# Patient Record
Sex: Female | Born: 1965 | Race: White | Hispanic: No | Marital: Single | Smoking: Current every day smoker
Health system: Southern US, Community
[De-identification: ages and names within clinical notes are randomized; demographics above are authoritative.]

## PROBLEM LIST (undated history)

## (undated) DIAGNOSIS — E079 Disorder of thyroid, unspecified: Secondary | ICD-10-CM

## (undated) DIAGNOSIS — I10 Essential (primary) hypertension: Secondary | ICD-10-CM

## (undated) DIAGNOSIS — B1920 Unspecified viral hepatitis C without hepatic coma: Secondary | ICD-10-CM

## (undated) DIAGNOSIS — Z22322 Carrier or suspected carrier of Methicillin resistant Staphylococcus aureus: Secondary | ICD-10-CM

## (undated) HISTORY — DX: Essential (primary) hypertension: I10

---

## 2001-08-13 ENCOUNTER — Other Ambulatory Visit: Admission: RE | Admit: 2001-08-13 | Discharge: 2001-08-13 | Payer: Self-pay | Admitting: Internal Medicine

## 2012-03-22 ENCOUNTER — Other Ambulatory Visit: Payer: Self-pay

## 2012-03-29 ENCOUNTER — Encounter: Payer: Self-pay | Admitting: *Deleted

## 2012-03-29 ENCOUNTER — Encounter: Payer: Self-pay | Admitting: Physician Assistant

## 2013-12-28 ENCOUNTER — Emergency Department (HOSPITAL_COMMUNITY): Payer: Self-pay

## 2013-12-28 ENCOUNTER — Emergency Department (HOSPITAL_COMMUNITY)
Admission: EM | Admit: 2013-12-28 | Discharge: 2013-12-28 | Disposition: A | Discharge status: Home / Self Care | Payer: Self-pay | Attending: Emergency Medicine | Admitting: Emergency Medicine

## 2013-12-28 ENCOUNTER — Encounter (HOSPITAL_COMMUNITY): Payer: Self-pay | Admitting: Emergency Medicine

## 2013-12-28 DIAGNOSIS — Y9389 Activity, other specified: Secondary | ICD-10-CM | POA: Insufficient documentation

## 2013-12-28 DIAGNOSIS — Z8614 Personal history of Methicillin resistant Staphylococcus aureus infection: Secondary | ICD-10-CM | POA: Insufficient documentation

## 2013-12-28 DIAGNOSIS — Z792 Long term (current) use of antibiotics: Secondary | ICD-10-CM | POA: Insufficient documentation

## 2013-12-28 DIAGNOSIS — L02419 Cutaneous abscess of limb, unspecified: Secondary | ICD-10-CM | POA: Insufficient documentation

## 2013-12-28 DIAGNOSIS — L03116 Cellulitis of left lower limb: Secondary | ICD-10-CM

## 2013-12-28 DIAGNOSIS — Y92410 Unspecified street and highway as the place of occurrence of the external cause: Secondary | ICD-10-CM | POA: Insufficient documentation

## 2013-12-28 DIAGNOSIS — F17200 Nicotine dependence, unspecified, uncomplicated: Secondary | ICD-10-CM | POA: Insufficient documentation

## 2013-12-28 DIAGNOSIS — Z8619 Personal history of other infectious and parasitic diseases: Secondary | ICD-10-CM | POA: Insufficient documentation

## 2013-12-28 DIAGNOSIS — I10 Essential (primary) hypertension: Secondary | ICD-10-CM | POA: Insufficient documentation

## 2013-12-28 DIAGNOSIS — Z79899 Other long term (current) drug therapy: Secondary | ICD-10-CM | POA: Insufficient documentation

## 2013-12-28 DIAGNOSIS — L03119 Cellulitis of unspecified part of limb: Principal | ICD-10-CM

## 2013-12-28 HISTORY — DX: Unspecified viral hepatitis C without hepatic coma: B19.20

## 2013-12-28 HISTORY — DX: Carrier or suspected carrier of methicillin resistant Staphylococcus aureus: Z22.322

## 2013-12-28 LAB — BASIC METABOLIC PANEL
BUN: 16 mg/dL (ref 6–23)
CALCIUM: 9.5 mg/dL (ref 8.4–10.5)
CO2: 24 mEq/L (ref 19–32)
Chloride: 98 mEq/L (ref 96–112)
Creatinine, Ser: 0.66 mg/dL (ref 0.50–1.10)
GFR calc Af Amer: >90 mL/min (ref >90)
GFR calc non Af Amer: >90 mL/min (ref >90)
GLUCOSE: 90 mg/dL (ref 70–99)
POTASSIUM: 3.9 meq/L (ref 3.7–5.3)
SODIUM: 136 meq/L — AB (ref 137–147)

## 2013-12-28 LAB — GLUCOSE, CAPILLARY: Glucose-Capillary: 150 mg/dL — ABNORMAL HIGH (ref 70–99)

## 2013-12-28 MED ORDER — VANCOMYCIN HCL IN DEXTROSE 1-5 GM/200ML-% IV SOLN
1000.0000 mg | Freq: Once | INTRAVENOUS | Status: AC
  Administered 2013-12-28: 1000 mg via INTRAVENOUS
  Filled 2013-12-28: qty 200

## 2013-12-28 MED ORDER — SODIUM CHLORIDE 0.9 % IV BOLUS (SEPSIS)
500.0000 mL | Freq: Once | INTRAVENOUS | Status: AC
  Administered 2013-12-28: 500 mL via INTRAVENOUS

## 2013-12-28 MED ORDER — SULFAMETHOXAZOLE-TRIMETHOPRIM 800-160 MG PO TABS: 1.0000 | ORAL_TABLET | Freq: Two times a day (BID) | ORAL | Status: DC

## 2013-12-28 MED ORDER — HYDROMORPHONE HCL PF 1 MG/ML IJ SOLN
1.0000 mg | Freq: Once | INTRAMUSCULAR | Status: AC
  Administered 2013-12-28: 1 mg via INTRAVENOUS
  Filled 2013-12-28: qty 1

## 2013-12-28 MED ORDER — OXYCODONE-ACETAMINOPHEN 5-325 MG PO TABS: 2.0000 | ORAL_TABLET | ORAL | Status: DC | PRN

## 2013-12-28 MED ORDER — ONDANSETRON HCL 4 MG/2ML IJ SOLN
4.0000 mg | Freq: Once | INTRAMUSCULAR | Status: AC
  Administered 2013-12-28: 4 mg via INTRAVENOUS
  Filled 2013-12-28: qty 2
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
## 2013-12-28 NOTE — Discharge Instructions (Signed)
X-ray and glucose were okay.  Recheck here in 2-3 days.   Take daily shower with soap and water. Simple dressing.   Restart antibiotic tomorrow.  Elevate leg. Pain medication

## 2013-12-28 NOTE — ED Notes (Signed)
cbg was 150 at this time. Kristine Wong
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
## 2013-12-28 NOTE — Progress Notes (Signed)
ANTIBIOTIC CONSULT NOTE - INITIAL  Pharmacy Consult for Vancomycin Indication: cellulitis  No Known Allergies  Patient Measurements: Height: 5\' 4"  (162.6 cm) Weight: 170 lb (77.111 kg) IBW/kg (Calculated) : 54.7  Vital Signs: Temp: 98 F (36.7 C) (01/10 1440) Temp src: Oral (01/10 1852) BP: 105/58 mmHg (01/10 1852) Pulse Rate: 58 (01/10 1852) Intake/Output from previous day:   Intake/Output from this shift:    Labs:  Recent Labs  12/28/13 1825  CREATININE 0.66   Estimated Creatinine Clearance: 86.5 ml/min (by C-G formula based on Cr of 0.66). No results found for this basename: VANCOTROUGH, VANCOPEAK, VANCORANDOM, GENTTROUGH, GENTPEAK, GENTRANDOM, TOBRATROUGH, TOBRAPEAK, TOBRARND, AMIKACINPEAK, AMIKACINTROU, AMIKACIN,  in the last 72 hours   Microbiology: No results found for this or any previous visit (from the past 720 hour(s)).  Medical History: Past Medical History  Diagnosis Date  . Essential hypertension, benign   . Hepatitis C   . MRSA (methicillin resistant staph aureus) culture positive     Medications:  Scheduled:   Assessment: Okay for Protocol, Vancomycin 1000mg  ordered in ED.  Goal of Therapy:  Vancomycin trough level 10-15 mcg/ml  Plan:  Will continue Vancomycin 1000mg  every 12 hours if admitted and Vancomycin continued. Measure antibiotic drug levels at steady state Follow up culture results  Mady GemmaHayes, Pansie Guggisberg R 12/28/2013,6:57 PM

## 2014-01-12 ENCOUNTER — Encounter (HOSPITAL_COMMUNITY): Payer: Self-pay | Admitting: Emergency Medicine

## 2014-01-12 ENCOUNTER — Emergency Department (HOSPITAL_COMMUNITY)
Admission: EM | Admit: 2014-01-12 | Discharge: 2014-01-12 | Disposition: A | Discharge status: Home / Self Care | Payer: Self-pay | Attending: Emergency Medicine | Admitting: Emergency Medicine

## 2014-01-12 DIAGNOSIS — Z79899 Other long term (current) drug therapy: Secondary | ICD-10-CM | POA: Insufficient documentation

## 2014-01-12 DIAGNOSIS — Z8614 Personal history of Methicillin resistant Staphylococcus aureus infection: Secondary | ICD-10-CM | POA: Insufficient documentation

## 2014-01-12 DIAGNOSIS — I10 Essential (primary) hypertension: Secondary | ICD-10-CM | POA: Insufficient documentation

## 2014-01-12 DIAGNOSIS — F17200 Nicotine dependence, unspecified, uncomplicated: Secondary | ICD-10-CM | POA: Insufficient documentation

## 2014-01-12 DIAGNOSIS — IMO0002 Reserved for concepts with insufficient information to code with codable children: Secondary | ICD-10-CM

## 2014-01-12 DIAGNOSIS — N949 Unspecified condition associated with female genital organs and menstrual cycle: Secondary | ICD-10-CM | POA: Insufficient documentation

## 2014-01-12 DIAGNOSIS — T7421XA Adult sexual abuse, confirmed, initial encounter: Secondary | ICD-10-CM | POA: Insufficient documentation

## 2014-01-12 MED ORDER — LEVONORGESTREL 0.75 MG PO TABS
ORAL_TABLET | ORAL | Status: AC
  Administered 2014-01-12: 17:00:00
  Filled 2014-01-12: qty 2

## 2014-01-12 MED ORDER — CEFIXIME 400 MG PO TABS
ORAL_TABLET | ORAL | Status: AC
  Administered 2014-01-12: 400 mg
  Filled 2014-01-12: qty 1

## 2014-01-12 MED ORDER — AZITHROMYCIN 1 G PO PACK
PACK | ORAL | Status: AC
  Administered 2014-01-12: 1 g
  Filled 2014-01-12: qty 1

## 2014-01-12 MED ORDER — PROMETHAZINE HCL 25 MG PO TABS
ORAL_TABLET | ORAL | Status: AC
  Administered 2014-01-12: 12.5 mg
  Filled 2014-01-12: qty 3

## 2014-01-12 MED ORDER — METRONIDAZOLE 500 MG PO TABS
ORAL_TABLET | ORAL | Status: AC
  Administered 2014-01-12: 2000 mg
  Filled 2014-01-12: qty 4
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
## 2014-01-12 NOTE — ED Provider Notes (Signed)
Medical screening examination/treatment/procedure(s) were performed by non-physician practitioner and as supervising physician I was immediately available for consultation/collaboration.  EKG Interpretation   None         Marquel Spoto L Kidada Ging, MD 01/12/14 2248 
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
## 2015-01-18 ENCOUNTER — Emergency Department (HOSPITAL_COMMUNITY)
Admission: EM | Admit: 2015-01-18 | Discharge: 2015-01-18 | Disposition: A | Discharge status: Home / Self Care | Payer: Self-pay | Attending: Emergency Medicine | Admitting: Emergency Medicine

## 2015-01-18 ENCOUNTER — Encounter (HOSPITAL_COMMUNITY): Payer: Self-pay | Admitting: Emergency Medicine

## 2015-01-18 DIAGNOSIS — M545 Low back pain, unspecified: Secondary | ICD-10-CM

## 2015-01-18 DIAGNOSIS — S3992XA Unspecified injury of lower back, initial encounter: Secondary | ICD-10-CM | POA: Insufficient documentation

## 2015-01-18 DIAGNOSIS — X58XXXA Exposure to other specified factors, initial encounter: Secondary | ICD-10-CM | POA: Insufficient documentation

## 2015-01-18 DIAGNOSIS — Z8619 Personal history of other infectious and parasitic diseases: Secondary | ICD-10-CM | POA: Insufficient documentation

## 2015-01-18 DIAGNOSIS — Y9389 Activity, other specified: Secondary | ICD-10-CM | POA: Insufficient documentation

## 2015-01-18 DIAGNOSIS — G8929 Other chronic pain: Secondary | ICD-10-CM

## 2015-01-18 DIAGNOSIS — Y9289 Other specified places as the place of occurrence of the external cause: Secondary | ICD-10-CM | POA: Insufficient documentation

## 2015-01-18 DIAGNOSIS — Y998 Other external cause status: Secondary | ICD-10-CM | POA: Insufficient documentation

## 2015-01-18 DIAGNOSIS — Z720 Tobacco use: Secondary | ICD-10-CM | POA: Insufficient documentation

## 2015-01-18 DIAGNOSIS — I10 Essential (primary) hypertension: Secondary | ICD-10-CM | POA: Insufficient documentation

## 2015-01-18 DIAGNOSIS — Z79899 Other long term (current) drug therapy: Secondary | ICD-10-CM | POA: Insufficient documentation

## 2015-01-18 DIAGNOSIS — Z8614 Personal history of Methicillin resistant Staphylococcus aureus infection: Secondary | ICD-10-CM | POA: Insufficient documentation

## 2015-01-18 MED ORDER — LISINOPRIL 20 MG PO TABS: 20.0000 mg | ORAL_TABLET | Freq: Every day | ORAL | Status: DC

## 2015-01-18 MED ORDER — ONDANSETRON 4 MG PO TBDP
4.0000 mg | ORAL_TABLET | Freq: Once | ORAL | Status: AC
  Administered 2015-01-18: 4 mg via ORAL
  Filled 2015-01-18: qty 1

## 2015-01-18 MED ORDER — FAMOTIDINE 20 MG PO TABS
20.0000 mg | ORAL_TABLET | Freq: Once | ORAL | Status: AC
  Administered 2015-01-18: 20 mg via ORAL
  Filled 2015-01-18: qty 1

## 2015-01-18 MED ORDER — HYDROCODONE-ACETAMINOPHEN 5-325 MG PO TABS
1.0000 | ORAL_TABLET | Freq: Once | ORAL | Status: AC
  Administered 2015-01-18: 1 via ORAL
  Filled 2015-01-18: qty 1

## 2015-01-18 MED ORDER — FAMOTIDINE 20 MG PO TABS: 20.0000 mg | ORAL_TABLET | Freq: Two times a day (BID) | ORAL | Status: DC

## 2015-01-18 MED ORDER — HYDROCODONE-ACETAMINOPHEN 5-325 MG PO TABS: 1.0000 | ORAL_TABLET | ORAL | Status: DC | PRN

## 2015-01-18 NOTE — ED Notes (Signed)
Upon assessment pt has tenderness to lumbar area, pt reports episodes of incontinence but states this is baseline due to chronic back issues needing surgery.

## 2015-01-18 NOTE — ED Provider Notes (Signed)
CSN: [id number]     Arrival date & time 01/18/15  1204 History  This chart was scribed for non-physician practitioner, Kerrie Buffalo, FNP,working with Donnetta Hutching, MD, by Karle Plumber, ED Scribe. This patient was seen in room APFT21/APFT21 and the patient's care was started at 1:03 PM.  Chief Complaint  Patient presents with  . Back Pain   Patient is a 49 y.o. female presenting with back pain. The history is provided by the patient and medical records. No language interpreter was used.  Back Pain Associated symptoms: no abdominal pain, no fever, no numbness and no weakness     HPI Comments:  Kristine Wong is a 49 y.o. female who presents to the Emergency Department complaining of severe lower back pain that began this morning after bending over. Pt states she felt a popping sensation and the pain began immediately. She reports she was diagnosed with DDD in 2014 (2 years ago) and states she has been putting off surgery. She states she takes Ibuprofen to manage the pain (1600 mg today already) with no significant relief of the pain, however, it does make her stomach burn. Walking and moving makes the pain worse. Denies alleviating factors. Denies nausea, vomiting, abdominal pain, fever, chills, loss of bowel or bladder function, numbness, tingling or weakness of the lower extremities. PMH of HTN, Hepatitis C and MRSA. Denies allergies to any medications.  Past Medical History  Diagnosis Date  . Essential hypertension, benign   . Hepatitis C   . MRSA (methicillin resistant staph aureus) culture positive    History reviewed. No pertinent past surgical history. Family History  Problem Relation Age of Onset  . Hypertension Other    History  Substance Use Topics  . Smoking status: Current Every Day Smoker -- 1.00 packs/day for 30 years    Types: Cigarettes  . Smokeless tobacco: Never Used  . Alcohol Use: Yes     Comment: Occasional   OB History    Gravida Para Term Preterm AB TAB SAB  Ectopic Multiple Living   Review of Systems  Constitutional: Negative for fever and chills.  Gastrointestinal: Negative for nausea, vomiting and abdominal pain.  Genitourinary:       No bowel or bladder incontinence.  Musculoskeletal: Positive for back pain.  Skin: Negative for wound.  Neurological: Negative for weakness and numbness.  All other systems reviewed and are negative.   Allergies  Review of patient's allergies indicates no known allergies.  Home Medications   Prior to Admission medications   Medication Sig Start Date End Date Taking? Authorizing Provider  b complex vitamins tablet Take 1 tablet by mouth daily.   Yes Historical Provider, MD  levothyroxine (SYNTHROID, LEVOTHROID) 25 MCG tablet Take 25 mcg by mouth daily before breakfast.   Yes Historical Provider, MD  famotidine (PEPCID) 20 MG tablet Take 1 tablet (20 mg total) by mouth 2 (two) times daily. 01/18/15   Hope Orlene Och, NP  HYDROcodone-acetaminophen (NORCO/VICODIN) 5-325 MG per tablet Take 1 tablet by mouth every 4 (four) hours as needed. 01/18/15   Hope Orlene Och, NP  lisinopril (PRINIVIL,ZESTRIL) 20 MG tablet Take 1 tablet (20 mg total) by mouth daily. 01/18/15   Hope Orlene Och, NP   Triage Vitals: BP 155/90 mmHg  Pulse 65  Temp(Src) 98.3 F (36.8 C) (Oral)  Resp 14  Ht  (1.626 m)  Wt 170 lb (77.111 kg)  BMI  29.17 kg/m2  SpO2 96%  LMP 01/04/2015 Physical Exam  Constitutional: She is oriented to person, place, and time. She appears well-developed and well-nourished. No distress.  HENT:  Head: Normocephalic and atraumatic.  Right Ear: Tympanic membrane normal.  Left Ear: Tympanic membrane normal.  Nose: Nose normal.  Mouth/Throat: Uvula is midline, oropharynx is clear and moist and mucous membranes are normal.  Eyes: EOM are normal.  Neck: Normal range of motion. Neck supple.  Cardiovascular: Normal rate and regular rhythm.   Pulses:      Dorsalis pedis pulses are 2+ on the right  side, and 2+ on the left side.       Posterior tibial pulses are 2+ on the right side, and 2+ on the left side.  Good circulation.  Pulmonary/Chest: Effort normal. She has no wheezes. She has no rales.  Abdominal: Soft. Bowel sounds are normal. There is no tenderness.  Musculoskeletal: Normal range of motion. She exhibits tenderness.       Lumbar back: She exhibits tenderness, pain and spasm. She exhibits normal pulse.  Tender to palpation of right lumbar area radiating to right sciatic nerve.  Neurological: She is alert and oriented to person, place, and time. She has normal strength. No cranial nerve deficit or sensory deficit. Gait normal.  Reflex Scores:      Bicep reflexes are 2+ on the right side and 2+ on the left side.      Brachioradialis reflexes are 2+ on the right side and 2+ on the left side.      Patellar reflexes are 2+ on the right side and 2+ on the left side.      Achilles reflexes are 2+ on the right side and 2+ on the left side. Ambulatory with no foot drag. Good touch sensation.  Skin: Skin is warm and dry.  Psychiatric: She has a normal mood and affect. Her behavior is normal.  Nursing note and vitals reviewed.   ED Course  Procedures (including critical care time) DIAGNOSTIC STUDIES: Oxygen Saturation is 96% on RA, adequate by my interpretation.   COORDINATION OF CARE: 1:10 PM- Will order pain medication, Pepcid and Zofran prior to discharge. Will give prescription of pain medication. Pt verbalizes understanding and agrees to plan.  Medications  HYDROcodone-acetaminophen (NORCO/VICODIN) 5-325 MG per tablet 1 tablet (1 tablet Oral Given 01/18/15 1328)  famotidine (PEPCID) tablet 20 mg (20 mg Oral Given 01/18/15 1328)  ondansetron (ZOFRAN-ODT) disintegrating tablet 4 mg (4 mg Oral Given 01/18/15 1328)     MDM  49 y.o. female with acute exacerbation of chronic low back pain that started when she bent forward to water her plants. Will treat for pain and muscle  spasm.  I have reviewed this patient's vital signs, nurses notes, I have discussed findings and plan of care with the patient and she voices understanding and agrees with plan. Stable for d/c without neuro deficits. Patient to follow up with her PCP as soon as possible.   Final diagnoses:  Acute exacerbation of chronic low back pain   I personally performed the services described in this documentation, which was scribed in my presence. The recorded information has been reviewed and is accurate.    [address]Hope [place], NP 01/18/15 1807  Donnetta HutchingBrian Cook, MD 01/20/15 [phone]

## 2015-01-18 NOTE — ED Notes (Signed)
Patient c/o lower back pain that radiates into right leg. Per patient bent over to put pants on and felt pop in back. Patient reports hx of DDD. Denies any complications with voiding. CNS intact. Patient ambulated into triage, gait staedy-patient noted to favor left side.

## 2015-12-24 ENCOUNTER — Encounter (HOSPITAL_COMMUNITY): Payer: Self-pay | Admitting: Emergency Medicine

## 2015-12-24 ENCOUNTER — Emergency Department (HOSPITAL_COMMUNITY): Payer: Self-pay

## 2015-12-24 ENCOUNTER — Emergency Department (HOSPITAL_COMMUNITY)
Admission: EM | Admit: 2015-12-24 | Discharge: 2015-12-24 | Disposition: A | Discharge status: Home / Self Care | Payer: Self-pay | Attending: Emergency Medicine | Admitting: Emergency Medicine

## 2015-12-24 DIAGNOSIS — Z79899 Other long term (current) drug therapy: Secondary | ICD-10-CM | POA: Insufficient documentation

## 2015-12-24 DIAGNOSIS — M7989 Other specified soft tissue disorders: Secondary | ICD-10-CM | POA: Insufficient documentation

## 2015-12-24 DIAGNOSIS — J45901 Unspecified asthma with (acute) exacerbation: Secondary | ICD-10-CM | POA: Insufficient documentation

## 2015-12-24 DIAGNOSIS — R110 Nausea: Secondary | ICD-10-CM | POA: Insufficient documentation

## 2015-12-24 DIAGNOSIS — I10 Essential (primary) hypertension: Secondary | ICD-10-CM | POA: Insufficient documentation

## 2015-12-24 DIAGNOSIS — F17210 Nicotine dependence, cigarettes, uncomplicated: Secondary | ICD-10-CM | POA: Insufficient documentation

## 2015-12-24 DIAGNOSIS — Z8619 Personal history of other infectious and parasitic diseases: Secondary | ICD-10-CM | POA: Insufficient documentation

## 2015-12-24 DIAGNOSIS — J40 Bronchitis, not specified as acute or chronic: Secondary | ICD-10-CM

## 2015-12-24 DIAGNOSIS — M549 Dorsalgia, unspecified: Secondary | ICD-10-CM | POA: Insufficient documentation

## 2015-12-24 DIAGNOSIS — H538 Other visual disturbances: Secondary | ICD-10-CM | POA: Insufficient documentation

## 2015-12-24 DIAGNOSIS — R6889 Other general symptoms and signs: Secondary | ICD-10-CM

## 2015-12-24 DIAGNOSIS — Z8614 Personal history of Methicillin resistant Staphylococcus aureus infection: Secondary | ICD-10-CM | POA: Insufficient documentation

## 2015-12-24 LAB — CBC WITH DIFFERENTIAL/PLATELET
BASOS ABS: 0 10*3/uL (ref 0.0–0.1)
Basophils Relative: 0 %
EOS ABS: 0.1 10*3/uL (ref 0.0–0.7)
EOS PCT: 1 %
HCT: 39.9 % (ref 36.0–46.0)
HEMOGLOBIN: 13.7 g/dL (ref 12.0–15.0)
LYMPHS ABS: 2.8 10*3/uL (ref 0.7–4.0)
Lymphocytes Relative: 38 %
MCH: 31.4 pg (ref 26.0–34.0)
MCHC: 34.3 g/dL (ref 30.0–36.0)
MCV: 91.3 fL (ref 78.0–100.0)
Monocytes Absolute: 0.4 10*3/uL (ref 0.1–1.0)
Monocytes Relative: 6 %
Neutro Abs: 4.2 10*3/uL (ref 1.7–7.7)
Neutrophils Relative %: 55 %
PLATELETS: 309 10*3/uL (ref 150–400)
RBC: 4.37 MIL/uL (ref 3.87–5.11)
RDW: 12.6 % (ref 11.5–15.5)
WBC: 7.4 10*3/uL (ref 4.0–10.5)

## 2015-12-24 LAB — COMPREHENSIVE METABOLIC PANEL
ALBUMIN: 3.7 g/dL (ref 3.5–5.0)
ALK PHOS: 51 U/L (ref 38–126)
ALT: 28 U/L (ref 14–54)
AST: 30 U/L (ref 15–41)
Anion gap: 8 (ref 5–15)
BUN: 16 mg/dL (ref 6–20)
CALCIUM: 8.8 mg/dL — AB (ref 8.9–10.3)
CHLORIDE: 107 mmol/L (ref 101–111)
CO2: 23 mmol/L (ref 22–32)
CREATININE: 0.79 mg/dL (ref 0.44–1.00)
GFR calc Af Amer: >60 mL/min (ref >60)
GFR calc non Af Amer: >60 mL/min (ref >60)
GLUCOSE: 104 mg/dL — AB (ref 65–99)
Potassium: 3.9 mmol/L (ref 3.5–5.1)
SODIUM: 138 mmol/L (ref 135–145)
Total Bilirubin: 0.3 mg/dL (ref 0.3–1.2)
Total Protein: 6.8 g/dL (ref 6.5–8.1)

## 2015-12-24 MED ORDER — FENTANYL CITRATE (PF) 100 MCG/2ML IJ SOLN
50.0000 ug | Freq: Once | INTRAMUSCULAR | Status: AC
  Administered 2015-12-24: 50 ug via INTRAVENOUS
  Filled 2015-12-24: qty 2

## 2015-12-24 MED ORDER — SODIUM CHLORIDE 0.9 % IV SOLN: INTRAVENOUS | Status: DC

## 2015-12-24 MED ORDER — ONDANSETRON HCL 4 MG/2ML IJ SOLN
4.0000 mg | Freq: Once | INTRAMUSCULAR | Status: AC
  Administered 2015-12-24: 4 mg via INTRAVENOUS
  Filled 2015-12-24: qty 2

## 2015-12-24 MED ORDER — IPRATROPIUM-ALBUTEROL 0.5-2.5 (3) MG/3ML IN SOLN
3.0000 mL | Freq: Once | RESPIRATORY_TRACT | Status: AC
  Administered 2015-12-24: 3 mL via RESPIRATORY_TRACT
  Filled 2015-12-24: qty 3

## 2015-12-24 MED ORDER — DM-GUAIFENESIN ER 30-600 MG PO TB12: 1.0000 | ORAL_TABLET | Freq: Two times a day (BID) | ORAL | Status: DC

## 2015-12-24 MED ORDER — ALBUTEROL SULFATE HFA 108 (90 BASE) MCG/ACT IN AERS
2.0000 | INHALATION_SPRAY | Freq: Four times a day (QID) | RESPIRATORY_TRACT | Status: DC
  Administered 2015-12-24: 2 via RESPIRATORY_TRACT
  Filled 2015-12-24: qty 6.7

## 2015-12-24 MED ORDER — SODIUM CHLORIDE 0.9 % IV BOLUS (SEPSIS)
1000.0000 mL | Freq: Once | INTRAVENOUS | Status: AC
  Administered 2015-12-24: 1000 mL via INTRAVENOUS

## 2015-12-24 NOTE — ED Notes (Signed)
Pt anxious and crying.

## 2015-12-24 NOTE — Discharge Instructions (Signed)
Rest as much as possible. Fluids frequently. Take the Motrin 800 mg every 8 hours. Use albuterol inhaler 2 puffs every 6 hours for 7 days. Take Mucinex DM every 12 hours for the next 7 days. Return for any new or worse symptoms.

## 2015-12-24 NOTE — ED Notes (Signed)
Pt c/o cough, chest pain, and back pain x one weeks.

## 2015-12-24 NOTE — ED Provider Notes (Signed)
CSN: [id number]     Arrival date & time 12/24/15  1926 History  By signing my name below, I, Bethel Born, attest that this documentation has been prepared under the direction and in the presence of Vanetta Mulders, MD. Electronically Signed: Bethel Born, ED Scribe. 12/24/2015. 8:56 PM   Chief Complaint  Patient presents with  . Cough   Patient is a 50 y.o. female presenting with cough. The history is provided by the patient. No language interpreter was used.  Cough Cough characteristics:  Productive Severity:  Moderate Onset quality:  Gradual Duration:  5 days Timing:  Constant Progression:  Worsening Chronicity:  New Relieved by:  Decongestant (Nyquil) Worsened by:  Nothing tried Ineffective treatments:  None tried Associated symptoms: chest pain, headaches, myalgias, shortness of breath and wheezing   Associated symptoms: no chills, no fever and no rash    Kristine Wong is a 50 y.o. female with history of HTN and hepatitis C who presents to the Emergency Department complaining of a new productive cough with gradual onset 5 days ago. Nyquil provided some relief in the cough at home. Associated symptoms include wheezing, chest pain, SOB, and headache. Pt denies fever and chills. She has no history of asthma but smokes 1 PPD. Pt states that she has been out of her antihypertensive for 2 weeks.   Past Medical History  Diagnosis Date  . Essential hypertension, benign   . Hepatitis C   . MRSA (methicillin resistant staph aureus) culture positive    History reviewed. No pertinent past surgical history. Family History  Problem Relation Age of Onset  . Hypertension Other    Social History  Substance Use Topics  . Smoking status: Current Every Day Smoker -- 1.00 packs/day for 30 years    Types: Cigarettes  . Smokeless tobacco: Never Used  . Alcohol Use: Yes     Comment: Occasional   OB History    Gravida Para Term Preterm AB TAB SAB Ectopic Multiple Living   1 1 1        1      Review of Systems  Constitutional: Negative for fever and chills.  Eyes: Positive for visual disturbance (blurred).  Respiratory: Positive for cough, shortness of breath and wheezing.   Cardiovascular: Positive for chest pain and leg swelling.  Gastrointestinal: Positive for nausea. Negative for vomiting.  Genitourinary: Negative for dysuria and hematuria.  Musculoskeletal: Positive for myalgias and back pain.  Skin: Negative for rash.  Neurological: Positive for headaches.  Hematological: Does not bruise/bleed easily.   Allergies  Review of patient's allergies indicates no known allergies.  Home Medications   Prior to Admission medications   Medication Sig Start Date End Date Taking? Authorizing Provider  b complex vitamins tablet Take 1 tablet by mouth daily.   Yes Historical Provider, MD  levothyroxine (SYNTHROID, LEVOTHROID) 25 MCG tablet Take 25 mcg by mouth daily before breakfast.   Yes Historical Provider, MD  lisinopril (PRINIVIL,ZESTRIL) 20 MG tablet Take 1 tablet (20 mg total) by mouth daily. 01/18/15  Yes Hope Orlene Och, NP  dextromethorphan-guaiFENesin (MUCINEX DM) 30-600 MG 12hr tablet Take 1 tablet by mouth 2 (two) times daily. 12/24/15   Vanetta Mulders, MD  famotidine (PEPCID) 20 MG tablet Take 1 tablet (20 mg total) by mouth 2 (two) times daily. Patient not taking: Reported on 12/24/2015 01/18/15   Janne Napoleon, NP  HYDROcodone-acetaminophen (NORCO/VICODIN) 5-325 MG per tablet Take 1 tablet by mouth every 4 (four) hours as needed. Patient not  taking: Reported on 12/24/2015 01/18/15   Janne Napoleon, NP   BP 147/89 mmHg  Pulse 64  Temp(Src) 97.8 F (36.6 C) (Oral)  Resp 26  Ht 5\' 4"  (1.626 m)  Wt 170 lb (77.111 kg)  BMI 29.17 kg/m2  SpO2 98%  LMP 12/14/2015 Physical Exam  Constitutional: She is oriented to person, place, and time. She appears well-developed and well-nourished. No distress.  HENT:  Head: Normocephalic and atraumatic.  Mouth/Throat: Mucous  membranes are dry.  Eyes: EOM are normal. Pupils are equal, round, and reactive to light. No scleral icterus.  Neck: Normal range of motion.  Cardiovascular: Normal rate, regular rhythm and normal heart sounds.   Pulmonary/Chest: Effort normal. She has wheezes (bilateral).  Abdominal: Soft. Bowel sounds are normal. She exhibits no distension. There is no tenderness.  Musculoskeletal: Normal range of motion.  No pitting edema at the BLEs  Neurological: She is alert and oriented to person, place, and time. No cranial nerve deficit. She exhibits normal muscle tone. Coordination normal.  Skin: Skin is warm and dry.  Psychiatric: She has a normal mood and affect. Judgment normal.  Nursing note and vitals reviewed.   ED Course  Procedures (including critical care time) DIAGNOSTIC STUDIES: Oxygen Saturation is 98% on RA,  normal by my interpretation.    COORDINATION OF CARE: 8:31 PM Discussed treatment plan which includes lab work, CXR, Zofran, fentanyl, a breathing treatment, and IVF with pt at bedside and pt agreed to plan.  Results for orders placed or performed during the hospital encounter of 12/24/15  CBC with Differential  Result Value Ref Range   WBC 7.4 4.0 - 10.5 K/uL   RBC 4.37 3.87 - 5.11 MIL/uL   Hemoglobin 13.7 12.0 - 15.0 g/dL   HCT 29.5 62.1 - 30.8 %   MCV 91.3 78.0 - 100.0 fL   MCH 31.4 26.0 - 34.0 pg   MCHC 34.3 30.0 - 36.0 g/dL   RDW 65.7 84.6 - 96.2 %   Platelets 309 150 - 400 K/uL   Neutrophils Relative % 55 %   Neutro Abs 4.2 1.7 - 7.7 K/uL   Lymphocytes Relative 38 %   Lymphs Abs 2.8 0.7 - 4.0 K/uL   Monocytes Relative 6 %   Monocytes Absolute 0.4 0.1 - 1.0 K/uL   Eosinophils Relative 1 %   Eosinophils Absolute 0.1 0.0 - 0.7 K/uL   Basophils Relative 0 %   Basophils Absolute 0.0 0.0 - 0.1 K/uL  Comprehensive metabolic panel  Result Value Ref Range   Sodium 138 135 - 145 mmol/L   Potassium 3.9 3.5 - 5.1 mmol/L   Chloride 107 101 - 111 mmol/L   CO2 23 22  - 32 mmol/L   Glucose, Bld 104 (H) 65 - 99 mg/dL   BUN 16 6 - 20 mg/dL   Creatinine, Ser 9.52 0.44 - 1.00 mg/dL   Calcium 8.8 (L) 8.9 - 10.3 mg/dL   Total Protein 6.8 6.5 - 8.1 g/dL   Albumin 3.7 3.5 - 5.0 g/dL   AST 30 15 - 41 U/L   ALT 28 14 - 54 U/L   Alkaline Phosphatase 51 38 - 126 U/L   Total Bilirubin 0.3 0.3 - 1.2 mg/dL   GFR calc non Af Amer >60 >60 mL/min   GFR calc Af Amer >60 >60 mL/min   Anion gap 8 5 - 15   Dg Chest 2 View  12/24/2015  CLINICAL DATA:  Productive cough and chest pain for 1  week. EXAM: CHEST  2 VIEW COMPARISON:  None. FINDINGS: The cardiac silhouette, mediastinal and hilar contours are within normal limits. The lungs are clear. No pleural effusion. No worrisome pulmonary lesions. Bilateral rim calcified breast prostheses are noted. The bony structures are intact. IMPRESSION: No acute cardiopulmonary findings. Electronically Signed   By: Rudie MeyerP.  Gallerani M.D.   On: 12/24/2015 20:09    I have personally reviewed and evaluated these images and lab results as part of my medical decision-making.   EKG Interpretation None      Medications  0.9 %  sodium chloride infusion (not administered)  albuterol (PROVENTIL HFA;VENTOLIN HFA) 108 (90 Base) MCG/ACT inhaler 2 puff (not administered)  ondansetron (ZOFRAN) injection 4 mg (4 mg Intravenous Given 12/24/15 2054)  sodium chloride 0.9 % bolus 1,000 mL (0 mLs Intravenous Stopped 12/24/15 2229)  fentaNYL (SUBLIMAZE) injection 50 mcg (50 mcg Intravenous Given 12/24/15 2057)  ipratropium-albuterol (DUONEB) 0.5-2.5 (3) MG/3ML nebulizer solution 3 mL (3 mLs Nebulization Given 12/24/15 2109)    MDM   Final diagnoses:  Bronchitis  Flu-like symptoms    Patient with flulike symptoms. Symptoms have been present since Saturday. Patient also with bronchitis and had wheezing related to reactive airway disease. Wheezing resolved with albuterol Atrovent nebulizer here. We'll continue at home with albuterol inhaler will continue with  Mucinex DM. And continue Motrin 800 mg every 8 hours. Work note provided. Patient will return for any new or worse symptoms. Chest x-rays negative for pneumonia. Labs without significant abnormalities.  I personally performed the services described in this documentation, which was scribed in my presence. The recorded information has been reviewed and is accurate.      Vanetta MuldersScott Angelos Wasco, MD 12/24/15 2231

## 2015-12-24 NOTE — ED Notes (Signed)
Pt had clamped fluids off, refused to finish bolus. IV removed.

## 2016-08-13 ENCOUNTER — Emergency Department (HOSPITAL_COMMUNITY)
Admission: EM | Admit: 2016-08-13 | Discharge: 2016-08-13 | Disposition: A | Discharge status: Home / Self Care | Payer: Self-pay | Attending: Emergency Medicine | Admitting: Emergency Medicine

## 2016-08-13 ENCOUNTER — Encounter (HOSPITAL_COMMUNITY): Payer: Self-pay | Admitting: Emergency Medicine

## 2016-08-13 DIAGNOSIS — F17210 Nicotine dependence, cigarettes, uncomplicated: Secondary | ICD-10-CM | POA: Insufficient documentation

## 2016-08-13 DIAGNOSIS — Z79899 Other long term (current) drug therapy: Secondary | ICD-10-CM | POA: Insufficient documentation

## 2016-08-13 DIAGNOSIS — I10 Essential (primary) hypertension: Secondary | ICD-10-CM | POA: Insufficient documentation

## 2016-08-13 DIAGNOSIS — Z760 Encounter for issue of repeat prescription: Secondary | ICD-10-CM | POA: Insufficient documentation

## 2016-08-13 DIAGNOSIS — K047 Periapical abscess without sinus: Secondary | ICD-10-CM | POA: Insufficient documentation

## 2016-08-13 MED ORDER — CLINDAMYCIN HCL 150 MG PO CAPS: 300.0000 mg | ORAL_CAPSULE | Freq: Four times a day (QID) | ORAL | 0 refills | Status: DC

## 2016-08-13 MED ORDER — BENZOCAINE 20 % MT AERO: INHALATION_SPRAY | Freq: Once | OROMUCOSAL | Status: DC

## 2016-08-13 MED ORDER — LISINOPRIL 20 MG PO TABS: 20.0000 mg | ORAL_TABLET | Freq: Every day | ORAL | 0 refills | Status: DC

## 2016-08-13 MED ORDER — CLINDAMYCIN HCL 150 MG PO CAPS
300.0000 mg | ORAL_CAPSULE | Freq: Once | ORAL | Status: AC
  Administered 2016-08-13: 300 mg via ORAL
  Filled 2016-08-13: qty 2

## 2016-08-13 MED ORDER — HYDROCODONE-ACETAMINOPHEN 5-325 MG PO TABS: ORAL_TABLET | ORAL | 0 refills | Status: DC

## 2016-08-13 MED ORDER — OXYCODONE-ACETAMINOPHEN 5-325 MG PO TABS
1.0000 | ORAL_TABLET | Freq: Once | ORAL | Status: AC
  Administered 2016-08-13: 1 via ORAL
  Filled 2016-08-13: qty 1

## 2016-08-13 NOTE — ED Triage Notes (Signed)
Pt comes in to ED w/ c/o R. Sided dental pain. Notable swelling to R. Side. Pain has increased since onset. OTC Ibuprofen taken with no relief. Pt AOx4.

## 2016-08-13 NOTE — ED Notes (Signed)
Pt states that she has a broken tooth that has abscessed.  Tried to see a dentist yesterday but unable to get seen.  Pt states that she is trying to get in a facility to be [place] in [place].

## 2016-08-13 NOTE — ED Provider Notes (Signed)
[facility] DEPT Provider Note   CSN: [id number] Arrival date & time: 08/13/16  1039     History   Chief Complaint Chief Complaint  Patient presents with  . Dental Problem    Right side    HPI Kristine Wong is a 50 y.o. female.  HPI   Kristine Wong is a 50 y.o. female who presents to the Emergency Department complaining of Right lower dental pain for several days. She reports that she has recently lost part of the filling to her right lower molar. She states that she tried to see a dentist yesterday but was unable to be seen. She reports worsening pain this morning upon waking. Pain is constant and radiating from her right jaw into her right ear and temple area. She tried taking over-the-counter medications without relief. She denies neck pain, fever, vomiting, difficulty swallowing or opening and closing her mouth.  She also requests a refill of her lisinopril, stating that she has been out of her medication for nearly a week.  She denies associated sx's   Past Medical History:  Diagnosis Date  . Essential hypertension, benign   . Hepatitis C   . MRSA (methicillin resistant staph aureus) culture positive     There are no active problems to display for this patient.   History reviewed. No pertinent surgical history.  OB History    Gravida Para Term Preterm AB Living   1 1 1     1    SAB TAB Ectopic Multiple Live Births                   Home Medications    Prior to Admission medications   Medication Sig Start Date End Date Taking? Authorizing Provider  b complex vitamins tablet Take 1 tablet by mouth daily.    Historical Provider, MD  clindamycin (CLEOCIN) 150 MG capsule Take 2 capsules (300 mg total) by mouth 4 (four) times daily. For 7 days 08/13/16   Carrie Schoonmaker, PA-C  dextromethorphan-guaiFENesin (MUCINEX DM) 30-600 MG 12hr tablet Take 1 tablet by mouth 2 (two) times daily. 12/24/15   Vanetta Mulders, MD  famotidine (PEPCID) 20 MG tablet Take 1 tablet (20  mg total) by mouth 2 (two) times daily. Patient not taking: Reported on 12/24/2015 01/18/15   Janne Napoleon, NP  HYDROcodone-acetaminophen (NORCO/VICODIN) 5-325 MG tablet Take one-two tabs po q 4-6 hrs prn pain 08/13/16   Yojan Paskett, PA-C  levothyroxine (SYNTHROID, LEVOTHROID) 25 MCG tablet Take 25 mcg by mouth daily before breakfast.    Historical Provider, MD  lisinopril (PRINIVIL,ZESTRIL) 20 MG tablet Take 1 tablet (20 mg total) by mouth daily. 08/13/16   Tona Qualley, PA-C    Family History Family History  Problem Relation Age of Onset  . Hypertension Other     Social History Social History  Substance Use Topics  . Smoking status: Current Every Day Smoker    Packs/day: 1.00    Years: 30.00    Types: Cigarettes  . Smokeless tobacco: Never Used  . Alcohol use Yes     Comment: Occasional     Allergies   Penicillins   Review of Systems Review of Systems  Constitutional: Negative for appetite change and fever.  HENT: Positive for dental problem and facial swelling. Negative for congestion, sore throat and trouble swallowing.   Eyes: Negative for pain and visual disturbance.  Musculoskeletal: Negative for neck pain and neck stiffness.  Neurological: Negative for dizziness, facial asymmetry and headaches.  Hematological: Negative for adenopathy.  All other systems reviewed and are negative.    Physical Exam Updated Vital Signs BP 174/92 (BP Location: Left Arm)   Pulse 67   Temp 98.5 F (36.9 C) (Oral)   Resp 18   Ht 5\' 5"  (1.651 m)   Wt 74.8 kg   SpO2 98%   BMI 27.46 kg/m   Physical Exam  Constitutional: She is oriented to person, place, and time. She appears well-developed and well-nourished. No distress.  HENT:  Head: Normocephalic and atraumatic.  Right Ear: Tympanic membrane and ear canal normal.  Left Ear: Tympanic membrane and ear canal normal.  Mouth/Throat: Uvula is midline, oropharynx is clear and moist and mucous membranes are normal. No trismus in  the jaw. Dental caries present. No dental abscesses or uvula swelling.  Tenderness and dental caries of the right lower first molar with fluctuance of the surrounding gingiva.  Mild lower facial swelling, obvious dental abscess. No trismus, or sublingual abnml.    Neck: Normal range of motion. Neck supple.  Cardiovascular: Normal rate and regular rhythm.   No murmur heard. Pulmonary/Chest: Effort normal and breath sounds normal. No respiratory distress.  Musculoskeletal: Normal range of motion.  Lymphadenopathy:    She has no cervical adenopathy.  Neurological: She is alert and oriented to person, place, and time. She exhibits normal muscle tone. Coordination normal.  Skin: Skin is warm and dry.  Nursing note and vitals reviewed.    ED Treatments / Results  Labs (all labs ordered are listed, but only abnormal results are displayed) Labs Reviewed - No data to display  EKG  EKG Interpretation None       Radiology No results found.  Procedures Procedures (including critical care time)   INCISION AND DRAINAGE Performed by: Maxwell Caul. Consent: Verbal consent obtained. Risks and benefits: risks, benefits and alternatives were discussed Type: abscess  Body area: right lower gingiva  Anesthesia: topical benzocaine spray  Incision was made with a #11 scalpel.  Complexity: simple  Drainage: purulent  Drainage amount: small  Packing material: none  Patient tolerance: Patient tolerated the procedure well with no immediate complications.    Medications Ordered in ED Medications  Benzocaine (HURRCAINE) 20 % mouth spray (not administered)  oxyCODONE-acetaminophen (PERCOCET/ROXICET) 5-325 MG per tablet 1 tablet (1 tablet Oral Given 08/13/16 1139)  clindamycin (CLEOCIN) capsule 300 mg (300 mg Oral Given 08/13/16 1139)     Initial Impression / Assessment and Plan / ED Course  I have reviewed the triage vital signs and the nursing notes.  Pertinent labs &  imaging results that were available during my care of the patient were reviewed by me and considered in my medical decision making (see chart for details).  Clinical Course   Small dental abscess of the right lower gingiva. Pain improved after incision and drainage of the abscess. Patient will be placed on pain medication and clindamycin.  No concerning sx's for Ludwig's angina.  Given referral info for dentistry.  As a courtesy, I have provided a refill of her lisinopril until she can see the clinic.     Final Clinical Impressions(s) / ED Diagnoses   Final diagnoses:  Dental abscess  Medication refill    New Prescriptions Discharge Medication List as of 08/13/2016 11:55 AM    START taking these medications   Details  clindamycin (CLEOCIN) 150 MG capsule Take 2 capsules (300 mg total) by mouth 4 (four) times daily. For 7 days, Starting Sat 08/13/2016, Print  Pauline Ausammy Delvin Hedeen, PA-C 08/13/16 1219    Marily MemosJason Mesner, MD 08/13/16 1432

## 2016-08-13 NOTE — ED Notes (Signed)
Pt verbalized understanding of no driving and to use caution within 4 hours of taking pain meds due to meds cause drowsiness.  Instructed pt to take all of antibiotics as prescribed. 

## 2016-08-13 NOTE — Discharge Instructions (Signed)
Warm water rinses.  Follow-up with a dentist soon

## 2016-10-30 ENCOUNTER — Emergency Department (HOSPITAL_COMMUNITY)
Admission: EM | Admit: 2016-10-30 | Discharge: 2016-10-30 | Disposition: A | Discharge status: Home / Self Care | Payer: Self-pay | Attending: Emergency Medicine | Admitting: Emergency Medicine

## 2016-10-30 ENCOUNTER — Encounter (HOSPITAL_COMMUNITY): Payer: Self-pay | Admitting: *Deleted

## 2016-10-30 DIAGNOSIS — E039 Hypothyroidism, unspecified: Secondary | ICD-10-CM | POA: Insufficient documentation

## 2016-10-30 DIAGNOSIS — I10 Essential (primary) hypertension: Secondary | ICD-10-CM | POA: Insufficient documentation

## 2016-10-30 DIAGNOSIS — F17210 Nicotine dependence, cigarettes, uncomplicated: Secondary | ICD-10-CM | POA: Insufficient documentation

## 2016-10-30 DIAGNOSIS — K029 Dental caries, unspecified: Secondary | ICD-10-CM | POA: Insufficient documentation

## 2016-10-30 DIAGNOSIS — Z79899 Other long term (current) drug therapy: Secondary | ICD-10-CM | POA: Insufficient documentation

## 2016-10-30 DIAGNOSIS — K0889 Other specified disorders of teeth and supporting structures: Secondary | ICD-10-CM

## 2016-10-30 HISTORY — DX: Disorder of thyroid, unspecified: E07.9

## 2016-10-30 MED ORDER — CLINDAMYCIN HCL 150 MG PO CAPS: 150.0000 mg | ORAL_CAPSULE | Freq: Four times a day (QID) | ORAL | 0 refills | Status: DC

## 2016-10-30 MED ORDER — HYDROCODONE-ACETAMINOPHEN 5-325 MG PO TABS: 2.0000 | ORAL_TABLET | ORAL | 0 refills | Status: DC | PRN

## 2016-10-30 NOTE — ED Triage Notes (Signed)
Pt reports left lower dental pain that started Friday. Pt reports the pain radiates into the left side of her face. Pt reports she has a dentist and plans to see them on Wednesday. Pt unsure of fever. Pt reports she has been taking OTC Ibuprofen 800mg  for the pain without relief.

## 2016-10-30 NOTE — ED Notes (Signed)
Pt verbalized understanding of no driving and to use caution within 4 hours of taking pain meds due to meds cause drowsiness.  Instructed pt to take all of antibiotics as prescribed. 

## 2016-10-30 NOTE — Discharge Instructions (Signed)
See your dentist for recheck  

## 2016-10-30 NOTE — ED Provider Notes (Signed)
[facility] Provider Note   CSN: [id number] Arrival date & time: 10/30/16  1025  By signing my name below, I, Rosario AdieWilliam Andrew Hiatt, attest that this documentation has been prepared under the direction and in the presence of Ok EdwardsLeslie Karen Sofia, PA-C.  Electronically Signed: Rosario AdieWilliam Andrew Hiatt, ED Scribe. 10/30/16. 11:21 AM.  History   Chief Complaint Chief Complaint  Patient presents with  . Dental Pain   The history is provided by the patient and medical records. No language interpreter was used.    HPI Comments: Kristine Wong is a 50 y.o. female with a PMHx of HTN, who presents to the Emergency Department complaining of gradually worsening, lower left-sided dental pain onset approximately 2 days ago. She reports associated subjective fever, chills, left ear pain, and left-sided facial swelling secondary to her dental pain. She has been taking 800mg  Ibuprofen at home with minimal relief of her current pain. Her pain is exacerbated with chewing. Pt is currently followed by a dental specialist and has f/u scheduled for this issue in ~3 days. No other associated symptoms or complaints at this time.   Past Medical History:  Diagnosis Date  . Essential hypertension, benign   . Hepatitis C   . MRSA (methicillin resistant staph aureus) culture positive   . Thyroid disease    hypothyroidism   There are no active problems to display for this patient.  History reviewed. No pertinent surgical history.  OB History    Gravida Para Term Preterm AB Living   1 1 1     1    SAB TAB Ectopic Multiple Live Births                 Home Medications    Prior to Admission medications   Medication Sig Start Date End Date Taking? Authorizing Provider  b complex vitamins tablet Take 1 tablet by mouth daily.    Historical Provider, MD  clindamycin (CLEOCIN) 150 MG capsule Take 2 capsules (300 mg total) by mouth 4 (four) times daily. For 7 days 08/13/16   Tammy Triplett, PA-C    dextromethorphan-guaiFENesin (MUCINEX DM) 30-600 MG 12hr tablet Take 1 tablet by mouth 2 (two) times daily. 12/24/15   Vanetta MuldersScott Zackowski, MD  HYDROcodone-acetaminophen (NORCO/VICODIN) 5-325 MG tablet Take one-two tabs po q 4-6 hrs prn pain 08/13/16   Tammy Triplett, PA-C  levothyroxine (SYNTHROID, LEVOTHROID) 25 MCG tablet Take 25 mcg by mouth daily before breakfast.    Historical Provider, MD  lisinopril (PRINIVIL,ZESTRIL) 20 MG tablet Take 1 tablet (20 mg total) by mouth daily. 08/13/16   Tammy Triplett, PA-C   Family History Family History  Problem Relation Age of Onset  . Hypertension Other    Social History Social History  Substance Use Topics  . Smoking status: Current Every Day Smoker    Packs/day: 1.00    Years: 30.00    Types: Cigarettes  . Smokeless tobacco: Never Used  . Alcohol use Yes     Comment: Occasional   Allergies   Penicillins  Review of Systems Review of Systems  Constitutional: Positive for chills and fever (subjective).  HENT: Positive for ear pain (left) and facial swelling.   Psychiatric/Behavioral: Negative for confusion.  All other systems reviewed and are negative.  Physical Exam Updated Vital Signs BP 171/91   Pulse 64   Temp 97.8 F (36.6 C) (Oral)   Resp 18   Ht 5\' 4"  (1.626 m)   Wt 185 lb (83.9 kg)   LMP 10/23/2016  SpO2 95%   BMI 31.76 kg/m   Physical Exam  Constitutional: She appears well-developed and well-nourished. No distress.  HENT:  Head: Normocephalic and atraumatic.  Right Ear: External ear normal.  Left Ear: External ear normal.  Nose: Nose normal.  Left first molar is flat and yellow. Swelling to the gum line and facial region is noted.   Eyes: Conjunctivae are normal.  Neck: Normal range of motion.  Cardiovascular: Normal rate.   Pulmonary/Chest: Effort normal.  Abdominal: She exhibits no distension.  Musculoskeletal: Normal range of motion.  Neurological: She is alert.  Skin: No pallor.  Psychiatric: She has a  normal mood and affect. Her behavior is normal.  Nursing note and vitals reviewed.  ED Treatments / Results  DIAGNOSTIC STUDIES: Oxygen Saturation is 95% on RA, adequate by my interpretation.   COORDINATION OF CARE: 11:18 AM-Discussed next steps with pt. Pt verbalized understanding and is agreeable with the plan.   Procedures Procedures   Medications Ordered in ED Medications - No data to display  Initial Impression / Assessment and Plan / ED Course  I have reviewed the triage vital signs and the nursing notes.  Clinical Course    Patient is a 50 yo female with dentalgia. No abscess requiring immediate incision and drainage. Exam not concerning for Ludwig's angina or pharyngeal abscess. Will treat with Clindamycin and short course of Hydrocodone. Pt instructed to follow-up with dentist.  Discussed return precautions. Pt is comfortable with above plan and is stable for discharge at this time. All questions were answered prior to disposition. Strict return precautions for f/u into the ED were discussed.   Final Clinical Impressions(s) / ED Diagnoses   Final diagnoses:  Dental caries   New Prescriptions New Prescriptions   CLINDAMYCIN (CLEOCIN) 150 MG CAPSULE    Take 1 capsule (150 mg total) by mouth every 6 (six) hours.   HYDROCODONE-ACETAMINOPHEN (NORCO/VICODIN) 5-325 MG TABLET    Take 2 tablets by mouth every 4 (four) hours as needed.   An After Visit Summary was printed and given to the patient.  I personally performed the services in this documentation, which was scribed in my presence.  The recorded information has been reviewed and considered.   Barnet PallKaren SofiaPAC.   Lonia SkinnerLeslie K MaypearlSofia, PA-C 10/30/16 1301    Azalia BilisKevin Campos, MD 10/30/16 [phone]

## 2017-04-14 ENCOUNTER — Encounter (HOSPITAL_COMMUNITY): Payer: Self-pay

## 2017-04-14 ENCOUNTER — Emergency Department (HOSPITAL_COMMUNITY): Payer: Self-pay

## 2017-04-14 ENCOUNTER — Emergency Department (HOSPITAL_COMMUNITY)
Admission: EM | Admit: 2017-04-14 | Discharge: 2017-04-14 | Disposition: A | Discharge status: Home / Self Care | Payer: Self-pay | Attending: Emergency Medicine | Admitting: Emergency Medicine

## 2017-04-14 DIAGNOSIS — Z791 Long term (current) use of non-steroidal anti-inflammatories (NSAID): Secondary | ICD-10-CM | POA: Insufficient documentation

## 2017-04-14 DIAGNOSIS — E039 Hypothyroidism, unspecified: Secondary | ICD-10-CM | POA: Insufficient documentation

## 2017-04-14 DIAGNOSIS — R0789 Other chest pain: Secondary | ICD-10-CM

## 2017-04-14 DIAGNOSIS — I10 Essential (primary) hypertension: Secondary | ICD-10-CM | POA: Insufficient documentation

## 2017-04-14 DIAGNOSIS — Z79899 Other long term (current) drug therapy: Secondary | ICD-10-CM | POA: Insufficient documentation

## 2017-04-14 DIAGNOSIS — F17210 Nicotine dependence, cigarettes, uncomplicated: Secondary | ICD-10-CM | POA: Insufficient documentation

## 2017-04-14 LAB — CBC
HEMATOCRIT: 40.7 % (ref 36.0–46.0)
HEMOGLOBIN: 13.8 g/dL (ref 12.0–15.0)
MCH: 31.4 pg (ref 26.0–34.0)
MCHC: 33.9 g/dL (ref 30.0–36.0)
MCV: 92.7 fL (ref 78.0–100.0)
Platelets: 228 10*3/uL (ref 150–400)
RBC: 4.39 MIL/uL (ref 3.87–5.11)
RDW: 12.7 % (ref 11.5–15.5)
WBC: 9.3 10*3/uL (ref 4.0–10.5)

## 2017-04-14 LAB — COMPREHENSIVE METABOLIC PANEL
ALT: 31 U/L (ref 14–54)
ANION GAP: 6 (ref 5–15)
AST: 26 U/L (ref 15–41)
Albumin: 3.8 g/dL (ref 3.5–5.0)
Alkaline Phosphatase: 50 U/L (ref 38–126)
BILIRUBIN TOTAL: 0.3 mg/dL (ref 0.3–1.2)
BUN: 15 mg/dL (ref 6–20)
CALCIUM: 9 mg/dL (ref 8.9–10.3)
CO2: 24 mmol/L (ref 22–32)
Chloride: 106 mmol/L (ref 101–111)
Creatinine, Ser: 0.83 mg/dL (ref 0.44–1.00)
GFR calc Af Amer: >60 mL/min (ref >60)
GLUCOSE: 93 mg/dL (ref 65–99)
Potassium: 4.3 mmol/L (ref 3.5–5.1)
Sodium: 136 mmol/L (ref 135–145)
Total Protein: 7.2 g/dL (ref 6.5–8.1)

## 2017-04-14 LAB — I-STAT TROPONIN, ED: TROPONIN I, POC: 0 ng/mL (ref 0.00–0.08)

## 2017-04-14 LAB — D-DIMER, QUANTITATIVE: D-Dimer, Quant: 0.38 ug/mL-FEU (ref 0.00–0.50)

## 2017-04-14 LAB — TROPONIN I: Troponin I: <0.03 ng/mL (ref <0.03)

## 2017-04-14 MED ORDER — NAPROXEN 500 MG PO TABS: 500.0000 mg | ORAL_TABLET | Freq: Two times a day (BID) | ORAL | 0 refills | Status: AC

## 2017-04-14 MED ORDER — OXYCODONE-ACETAMINOPHEN 5-325 MG PO TABS
1.0000 | ORAL_TABLET | Freq: Once | ORAL | Status: AC
  Administered 2017-04-14: 1 via ORAL
  Filled 2017-04-14: qty 1

## 2017-04-14 MED ORDER — LISINOPRIL 10 MG PO TABS: 10.0000 mg | ORAL_TABLET | Freq: Every day | ORAL | 0 refills | Status: AC

## 2017-04-14 MED ORDER — CITALOPRAM HYDROBROMIDE 20 MG PO TABS: 20.0000 mg | ORAL_TABLET | Freq: Every day | ORAL | 0 refills | Status: AC

## 2017-04-14 MED ORDER — ONDANSETRON 4 MG PO TBDP
ORAL_TABLET | ORAL | Status: AC
  Filled 2017-04-14: qty 1

## 2017-04-14 MED ORDER — ONDANSETRON 4 MG PO TBDP
4.0000 mg | ORAL_TABLET | Freq: Once | ORAL | Status: AC
  Administered 2017-04-14: 4 mg via ORAL
  Filled 2017-04-14: qty 1

## 2017-04-14 MED ORDER — LEVOTHYROXINE SODIUM 50 MCG PO TABS: 50.0000 ug | ORAL_TABLET | Freq: Every day | ORAL | 0 refills | Status: AC

## 2017-04-14 MED ORDER — HYDROCODONE-ACETAMINOPHEN 5-325 MG PO TABS: 1.0000 | ORAL_TABLET | Freq: Four times a day (QID) | ORAL | 0 refills | Status: AC | PRN

## 2017-04-14 NOTE — ED Notes (Signed)
pts in [facility] informed of wait and reasons for delay

## 2017-04-14 NOTE — ED Notes (Signed)
Informed of delay and hope of rooming soon

## 2017-04-14 NOTE — ED Provider Notes (Signed)
[facility] Provider Note   CSN: [id number] Arrival date & time: 04/14/17  1304     History   Chief Complaint Chief Complaint  Patient presents with  . Chest Pain    HPI Kristine Wong is a 51 y.o. female.  Patient complains of chest pain. Some pain with inspiration.   The history is provided by the patient.  Chest Pain   This is a new problem. The current episode started more than 2 days ago. The problem occurs constantly. The problem has not changed since onset.The pain is associated with movement. The pain is present in the substernal region. The pain is at a severity of 8/10. The pain is moderate. The quality of the pain is described as sharp. The pain does not radiate. Pertinent negatives include no abdominal pain, no back pain, no cough and no headaches.  Pertinent negatives for past medical history include no seizures.    Past Medical History:  Diagnosis Date  . Essential hypertension, benign   . Hepatitis C   . MRSA (methicillin resistant staph aureus) culture positive   . Thyroid disease    hypothyroidism    There are no active problems to display for this patient.   History reviewed. No pertinent surgical history.  OB History    Gravida Para Term Preterm AB Living   SAB TAB Ectopic Multiple Live Births                   Home Medications    Prior to Admission medications   Medication Sig Start Date End Date Taking? Authorizing Provider  b complex vitamins tablet Take 1 tablet by mouth daily.   Yes Historical Provider, MD  ibuprofen (ADVIL,MOTRIN) 200 MG tablet Take 200 mg by mouth every 6 (six) hours as needed for mild pain or moderate pain.   Yes Historical Provider, MD  citalopram (CELEXA) 20 MG tablet Take 1 tablet (20 mg total) by mouth daily. 04/14/17   Bethann Berkshire, MD  HYDROcodone-acetaminophen (NORCO/VICODIN) 5-325 MG tablet Take 1 tablet by mouth every 6 (six) hours as needed for moderate pain. 04/14/17   Bethann Berkshire,  MD  levothyroxine (SYNTHROID, LEVOTHROID) 50 MCG tablet Take 1 tablet (50 mcg total) by mouth daily before breakfast. 04/14/17   Bethann Berkshire, MD  lisinopril (PRINIVIL,ZESTRIL) 10 MG tablet Take 1 tablet (10 mg total) by mouth daily. 04/14/17   Bethann Berkshire, MD  naproxen (NAPROSYN) 500 MG tablet Take 1 tablet (500 mg total) by mouth 2 (two) times daily. 04/14/17   Bethann Berkshire, MD    Family History Family History  Problem Relation Age of Onset  . Hypertension Other     Social History Social History  Substance Use Topics  . Smoking status: Current Every Day Smoker    Packs/day: 1.00    Years: 30.00    Types: Cigarettes  . Smokeless tobacco: Never Used  . Alcohol use Yes     Comment: Occasional     Allergies   Penicillins   Review of Systems Review of Systems  Constitutional: Negative for appetite change and fatigue.  HENT: Negative for congestion, ear discharge and sinus pressure.   Eyes: Negative for discharge.  Respiratory: Negative for cough.   Cardiovascular: Positive for chest pain.  Gastrointestinal: Negative for abdominal pain and diarrhea.  Genitourinary: Negative for frequency and hematuria.  Musculoskeletal: Negative for back pain.  Skin: Negative for rash.  Neurological: Negative for seizures  and headaches.  Psychiatric/Behavioral: Negative for hallucinations.     Physical Exam Updated Vital Signs BP (!) 158/97 (BP Location: Right Arm)   Pulse 81   Temp 98.3 F (36.8 C) (Oral)   Resp (!) 22   Wt 185 lb (83.9 kg)   LMP 03/31/2017   SpO2 99%   BMI 31.76 kg/m   Physical Exam  Constitutional: She is oriented to person, place, and time. She appears well-developed.  HENT:  Head: Normocephalic.  Eyes: Conjunctivae and EOM are normal. No scleral icterus.  Neck: Neck supple. No thyromegaly present.  Cardiovascular: Normal rate and regular rhythm.  Exam reveals no gallop and no friction rub.   No murmur heard. Pulmonary/Chest: No stridor. She has no  wheezes. She has no rales. She exhibits tenderness.  Abdominal: She exhibits no distension. There is no tenderness. There is no rebound.  Musculoskeletal: Normal range of motion. She exhibits no edema.  Lymphadenopathy:    She has no cervical adenopathy.  Neurological: She is oriented to person, place, and time. She exhibits normal muscle tone. Coordination normal.  Skin: No rash noted. No erythema.  Psychiatric: She has a normal mood and affect. Her behavior is normal.     ED Treatments / Results  Labs (all labs ordered are listed, but only abnormal results are displayed) Labs Reviewed  CBC  COMPREHENSIVE METABOLIC PANEL  TROPONIN I  D-DIMER, QUANTITATIVE (NOT AT [facility])  I-STAT TROPOININ, ED    EKG  EKG Interpretation  Date/Time:  Friday April 14 2017 13:28:11 EDT Ventricular Rate:  75 PR Interval:  132 QRS Duration: 80 QT Interval:  384 QTC Calculation: 428 R Axis:   5 Text Interpretation:  Normal sinus rhythm Normal ECG Confirmed by Temeca Somma  MD, [place] [phone]) on 04/14/2017 4:27:27 PM       Radiology Dg Chest 2 View  Result Date: 04/14/2017 CLINICAL DATA:  Chest pain EXAM: CHEST  2 VIEW COMPARISON:  12/24/2015 FINDINGS: The heart size and mediastinal contours are within normal limits. Both lungs are clear. The visualized skeletal structures are unremarkable. Calcified breast implants. IMPRESSION: No active cardiopulmonary disease. Electronically Signed   By: Jasmine Pang M.D.   On: 04/14/2017 14:46    Procedures Procedures (including critical care time)  Medications Ordered in ED Medications  oxyCODONE-acetaminophen (PERCOCET/ROXICET) 5-325 MG per tablet 1 tablet (1 tablet Oral Given 04/14/17 1651)  ondansetron (ZOFRAN-ODT) disintegrating tablet 4 mg (4 mg Oral Given by Other 04/14/17 1717)     Initial Impression / Assessment and Plan / ED Course  I have reviewed the triage vital signs and the nursing notes.  Pertinent labs & imaging results that were available  during my care of the patient were reviewed by me and considered in my medical decision making (see chart for details).     EKG chest x-ray troponin 2 all negative. D-dimer negative. Additional history patient has recently stopped her Celexa her Synthroid and her lisinopril because she ran out of it. Patient diagnosed with chest wall pain she is given some Naprosyn and Vicodin and her Celexa Synthroid and lisinopril was refilled for a month. Patient was instructed to follow-up with her PCP  Final Clinical Impressions(s) / ED Diagnoses   Final diagnoses:  Atypical chest pain    New Prescriptions New Prescriptions   CITALOPRAM (CELEXA) 20 MG TABLET    Take 1 tablet (20 mg total) by mouth daily.   HYDROCODONE-ACETAMINOPHEN (NORCO/VICODIN) 5-325 MG TABLET    Take 1 tablet by mouth every 6 (  six) hours as needed for moderate pain.   LEVOTHYROXINE (SYNTHROID, LEVOTHROID) 50 MCG TABLET    Take 1 tablet (50 mcg total) by mouth daily before breakfast.   LISINOPRIL (PRINIVIL,ZESTRIL) 10 MG TABLET    Take 1 tablet (10 mg total) by mouth daily.   NAPROXEN (NAPROSYN) 500 MG TABLET    Take 1 tablet (500 mg total) by mouth 2 (two) times daily.     Bethann Berkshire, MD 04/14/17 [phone]

## 2017-04-14 NOTE — ED Notes (Signed)
Out of waiting room

## 2017-04-14 NOTE — ED Triage Notes (Signed)
Pt reports chest pain that began Wednesday night. Pain on left side of chest and radiates to left shoulder and jaw. Reports nausea

## 2017-04-14 NOTE — Discharge Instructions (Signed)
Follow-up in 1-2 weeks.

## 2017-04-14 NOTE — ED Triage Notes (Signed)
Chest pain for 2 days-  No evaluation in ED  No PCP   Drove here from [facility]

## 2017-04-25 ENCOUNTER — Emergency Department (HOSPITAL_COMMUNITY)
Admission: EM | Admit: 2017-04-25 | Discharge: 2017-04-25 | Disposition: A | Discharge status: Home / Self Care | Payer: Self-pay | Attending: Emergency Medicine | Admitting: Emergency Medicine

## 2017-04-25 ENCOUNTER — Encounter (HOSPITAL_COMMUNITY): Payer: Self-pay | Admitting: *Deleted

## 2017-04-25 ENCOUNTER — Emergency Department (HOSPITAL_COMMUNITY): Payer: Self-pay

## 2017-04-25 DIAGNOSIS — R0781 Pleurodynia: Secondary | ICD-10-CM | POA: Insufficient documentation

## 2017-04-25 DIAGNOSIS — F17210 Nicotine dependence, cigarettes, uncomplicated: Secondary | ICD-10-CM | POA: Insufficient documentation

## 2017-04-25 DIAGNOSIS — I10 Essential (primary) hypertension: Secondary | ICD-10-CM | POA: Insufficient documentation

## 2017-04-25 DIAGNOSIS — M791 Myalgia: Secondary | ICD-10-CM | POA: Insufficient documentation

## 2017-04-25 DIAGNOSIS — R079 Chest pain, unspecified: Secondary | ICD-10-CM

## 2017-04-25 DIAGNOSIS — Z79899 Other long term (current) drug therapy: Secondary | ICD-10-CM | POA: Insufficient documentation

## 2017-04-25 LAB — C-REACTIVE PROTEIN: CRP: <0.8 mg/dL (ref <1.0)

## 2017-04-25 LAB — BASIC METABOLIC PANEL
Anion gap: 6 (ref 5–15)
BUN: 15 mg/dL (ref 6–20)
CALCIUM: 8.8 mg/dL — AB (ref 8.9–10.3)
CHLORIDE: 106 mmol/L (ref 101–111)
CO2: 25 mmol/L (ref 22–32)
Creatinine, Ser: 0.58 mg/dL (ref 0.44–1.00)
GFR calc non Af Amer: >60 mL/min (ref >60)
Glucose, Bld: 98 mg/dL (ref 65–99)
Potassium: 4.2 mmol/L (ref 3.5–5.1)
SODIUM: 137 mmol/L (ref 135–145)

## 2017-04-25 LAB — CBC
HCT: 39 % (ref 36.0–46.0)
Hemoglobin: 13.4 g/dL (ref 12.0–15.0)
MCH: 31.5 pg (ref 26.0–34.0)
MCHC: 34.4 g/dL (ref 30.0–36.0)
MCV: 91.8 fL (ref 78.0–100.0)
PLATELETS: 294 10*3/uL (ref 150–400)
RBC: 4.25 MIL/uL (ref 3.87–5.11)
RDW: 12.6 % (ref 11.5–15.5)
WBC: 7.4 10*3/uL (ref 4.0–10.5)

## 2017-04-25 LAB — SEDIMENTATION RATE: SED RATE: 14 mm/h (ref 0–22)

## 2017-04-25 LAB — I-STAT TROPONIN, ED: TROPONIN I, POC: 0.01 ng/mL (ref 0.00–0.08)

## 2017-04-25 LAB — D-DIMER, QUANTITATIVE: D-Dimer, Quant: 0.42 ug/mL-FEU (ref 0.00–0.50)

## 2017-04-25 MED ORDER — ONDANSETRON HCL 4 MG/2ML IJ SOLN
4.0000 mg | Freq: Once | INTRAMUSCULAR | Status: AC
  Administered 2017-04-25: 4 mg via INTRAVENOUS
  Filled 2017-04-25: qty 2

## 2017-04-25 MED ORDER — MORPHINE SULFATE (PF) 4 MG/ML IV SOLN
4.0000 mg | Freq: Once | INTRAVENOUS | Status: AC
  Administered 2017-04-25: 4 mg via INTRAVENOUS
  Filled 2017-04-25: qty 1

## 2017-04-25 MED ORDER — IBUPROFEN 600 MG PO TABS: 600.0000 mg | ORAL_TABLET | Freq: Three times a day (TID) | ORAL | 0 refills | Status: AC

## 2017-04-25 NOTE — Discharge Instructions (Signed)
Someone from Dr. Verna CzechBranch's office should call you this week to arrange an appt.  Also try to contact one of the primary providers listed to establish primary care.

## 2017-04-25 NOTE — ED Provider Notes (Signed)
Patient with anterior chest pain radiating to the back, pleuritic onset approximately 3 weeks ago pain is been constant since 10 PM yesterday worse with lying supine improved with sitting upright . No fever. She does admit to slight cough. She is treated herself with ibuprofen with partial relief. On exam no distress lungs clear auscultation heart regular rate and rhythm abdomen soft nontender extremities without edema chest is tender anteriorly pain is also reproduced easily by forcible abduction of either shoulder. Radial pulses 2+ bilaterally. DP pulses 2+ bilaterally.  Exam consistent with chest wall pain. History consistent with costochondritis or pericarditis. Low pretest clinical probability for pulmonary embolism. Negative d-dimer. Low pretest clinical probability for acute coronary syndrome. Heart score equals 2 . I counseled patient for 5 minutes on smoking cessation    Doug SouJacubowitz, Kairah Leoni, MD 04/25/17 1211

## 2017-04-25 NOTE — ED Notes (Signed)
ED Provider at bedside. 

## 2017-04-25 NOTE — ED Triage Notes (Signed)
Pt c/o chest pain that goes across the entire chest that started last night. Pt also c/o SOB and nausea. Deniess dizziness. Pt reports the pain is worse with breathing. Pt has taken pain pill, Ibuprofen and baby aspirin this morning without relief of pain.

## 2017-04-25 NOTE — ED Provider Notes (Signed)
[facility]-EMERGENCY DEPT Provider Note   CSN: [id number] Arrival date & time: 04/25/17  [id number]     History   Chief Complaint Chief Complaint  Patient presents with  . Chest Pain    HPI Kristine Wong is a 51 y.o. female.  HPI  Kristine Wong is a 51 y.o. female who presents to the Emergency Department complaining of persistent upper chest pain for nearly two weeks.  She describes an aching pain across her upper chest, shoulders and upper back.  Pain is worse with deep breathing and when lying on her sides. Pain improves with ibuprofen.  She was seen here end of April for same.  Pain improved somewhat, but did not resolve.  She denies fever, cough, shortness of breath, vomiting and extremity pain Past Medical History:  Diagnosis Date  . Essential hypertension, benign   . Hepatitis C   . MRSA (methicillin resistant staph aureus) culture positive   . Thyroid disease    hypothyroidism    There are no active problems to display for this patient.   History reviewed. No pertinent surgical history.  OB History    Gravida Para Term Preterm AB Living   1 1 1     1    SAB TAB Ectopic Multiple Live Births                   Home Medications    Prior to Admission medications   Medication Sig Start Date End Date Taking? Authorizing Provider  b complex vitamins tablet Take 1 tablet by mouth daily.   Yes [provider]  citalopram (CELEXA) 20 MG tablet Take 1 tablet (20 mg total) by mouth daily. 04/14/17  Yes Bethann BerkshireZammit, Joseph, MD  HYDROcodone-acetaminophen (NORCO/VICODIN) 5-325 MG tablet Take 1 tablet by mouth every 6 (six) hours as needed for moderate pain. 04/14/17  Yes Bethann BerkshireZammit, Joseph, MD  ibuprofen (ADVIL,MOTRIN) 200 MG tablet Take 200 mg by mouth every 6 (six) hours as needed for mild pain or moderate pain.   Yes [provider]  levothyroxine (SYNTHROID, LEVOTHROID) 50 MCG tablet Take 1 tablet (50 mcg total) by mouth daily before breakfast. 04/14/17  Yes Bethann BerkshireZammit, Joseph,  MD  lisinopril (PRINIVIL,ZESTRIL) 10 MG tablet Take 1 tablet (10 mg total) by mouth daily. 04/14/17  Yes Bethann BerkshireZammit, Joseph, MD  naproxen (NAPROSYN) 500 MG tablet Take 1 tablet (500 mg total) by mouth 2 (two) times daily. Patient not taking: Reported on 04/25/2017 04/14/17   Bethann BerkshireZammit, Joseph, MD    Family History Family History  Problem Relation Age of Onset  . Hypertension Other     Social History Social History  Substance Use Topics  . Smoking status: Current Every Day Smoker    Packs/day: 1.00    Years: 30.00    Types: Cigarettes  . Smokeless tobacco: Never Used  . Alcohol use Yes     Comment: Occasional     Allergies   Penicillins   Review of Systems Review of Systems  Constitutional: Negative for chills and fever.  HENT: Negative for congestion and sinus pressure.   Eyes: Negative for visual disturbance.  Respiratory: Negative for cough, chest tightness, shortness of breath and wheezing.   Cardiovascular: Positive for chest pain.  Gastrointestinal: Negative for abdominal pain, nausea and vomiting.  Genitourinary: Negative for flank pain.  Musculoskeletal: Positive for back pain (upper back pain) and myalgias. Negative for neck pain.  Neurological: Negative for weakness, numbness and headaches.     Physical Exam Updated Vital Signs  BP (!) 139/94 (BP Location: Left Arm)   Pulse (!) 57   Temp 97.6 F (36.4 C) (Oral)   Resp 20   Ht 5\' 4"  (1.626 m)   Wt 83.9 kg   LMP 03/28/2017   SpO2 97%   BMI 31.76 kg/m   Physical Exam   ED Treatments / Results  Labs (all labs ordered are listed, but only abnormal results are displayed) Labs Reviewed  BASIC METABOLIC PANEL - Abnormal; Notable for the following:       Result Value   Calcium 8.8 (*)    All other components within normal limits  CBC  D-DIMER, QUANTITATIVE (NOT AT [facility])  Rosezena Sensor, ED    EKG  EKG Interpretation  Date/Time:  Tuesday Apr 25 2017 09:20:41 EDT Ventricular Rate:  63 PR Interval:      QRS Duration: 94 QT Interval:  434 QTC Calculation: 445 R Axis:   48 Text Interpretation:  Sinus rhythm Low voltage, precordial leads Probable anteroseptal infarct, old ST elevation, consider inferior injury No significant change since last tracing Confirmed by Ethelda Chick  MD, SAM [phone]) on 04/25/2017 9:31:13 AM       Radiology Dg Chest 2 View  Result Date: 04/25/2017 CLINICAL DATA:  Chest pain and shortness of Breath EXAM: CHEST  2 VIEW COMPARISON:  04/14/2017 FINDINGS: The heart size and mediastinal contours are within normal limits. Both lungs are clear. The visualized skeletal structures show old left rib fractures. IMPRESSION: No active cardiopulmonary disease. Electronically Signed   By: Alcide Clever M.D.   On: 04/25/2017 09:48    Procedures Procedures (including critical care time)  Medications Ordered in ED Medications  morphine 4 MG/ML injection 4 mg (4 mg Intravenous Given 04/25/17 1043)  ondansetron (ZOFRAN) injection 4 mg (4 mg Intravenous Given 04/25/17 1043)     Initial Impression / Assessment and Plan / ED Course  I have reviewed the triage vital signs and the nursing notes.  Pertinent labs & imaging results that were available during my care of the patient were reviewed by me and considered in my medical decision making (see chart for details).    Pt well appearing.  Vitals, labs, CXR and EKG all reassuring.     pt also seen by Dr. Ethelda Chick and care plan discussed.  Consulted cards, spoke with Dr. Wyline Mood who agrees to see pt in office this week for possible echo.    Pt appears stable for d/c, agrees to ibuprofen.    Final Clinical Impressions(s) / ED Diagnoses   Final diagnoses:  Nonspecific chest pain    New Prescriptions New Prescriptions   No medications on file     Rosey Bath 04/26/17 2253    Doug Sou, MD 04/27/17 1243

## 2017-04-25 NOTE — ED Notes (Signed)
PO fluids provided to the pt at this time.

## 2017-04-28 ENCOUNTER — Encounter: Payer: Self-pay | Admitting: Cardiology

## 2017-04-28 ENCOUNTER — Ambulatory Visit: Payer: Self-pay | Admitting: Cardiology

## 2017-04-28 NOTE — Progress Notes (Deleted)
Clinical Summary Ms. Kohli is a 51 y.o.female seen as new patient  1. Chest pain - seen in ER 04/25/17 with chest pain. Worst with position, partial relief with ibuprofen  - CRP normal, ESR normal, D-dimer negative, trop neg - CXR EKG    Past Medical History:  Diagnosis Date  . Essential hypertension, benign   . Hepatitis C   . MRSA (methicillin resistant staph aureus) culture positive   . Thyroid disease    hypothyroidism     Allergies  Allergen Reactions  . Penicillins Rash    Has patient had a PCN reaction causing immediate rash, facial/tongue/throat swelling, SOB or lightheadedness with hypotension: yes Has patient had a PCN reaction causing severe rash involving mucus membranes or skin necrosis: No Has patient had a PCN reaction that required hospitalization No Has patient had a PCN reaction occurring within the last 10 years: No If all of the above answers are "NO", then may proceed with Cephalosporin use.      Current Outpatient Prescriptions  Medication Sig Dispense Refill  . b complex vitamins tablet Take 1 tablet by mouth daily.    . citalopram (CELEXA) 20 MG tablet Take 1 tablet (20 mg total) by mouth daily. 30 tablet 0  . HYDROcodone-acetaminophen (NORCO/VICODIN) 5-325 MG tablet Take 1 tablet by mouth every 6 (six) hours as needed for moderate pain. 20 tablet 0  . ibuprofen (ADVIL,MOTRIN) 600 MG tablet Take 1 tablet (600 mg total) by mouth 3 (three) times daily. Take with food 30 tablet 0  . levothyroxine (SYNTHROID, LEVOTHROID) 50 MCG tablet Take 1 tablet (50 mcg total) by mouth daily before breakfast. 30 tablet 0  . lisinopril (PRINIVIL,ZESTRIL) 10 MG tablet Take 1 tablet (10 mg total) by mouth daily. 30 tablet 0  . naproxen (NAPROSYN) 500 MG tablet Take 1 tablet (500 mg total) by mouth 2 (two) times daily. (Patient not taking: Reported on 04/25/2017) 30 tablet 0   No current facility-administered medications for this visit.      No past surgical  history on file.   Allergies  Allergen Reactions  . Penicillins Rash    Has patient had a PCN reaction causing immediate rash, facial/tongue/throat swelling, SOB or lightheadedness with hypotension: yes Has patient had a PCN reaction causing severe rash involving mucus membranes or skin necrosis: No Has patient had a PCN reaction that required hospitalization No Has patient had a PCN reaction occurring within the last 10 years: No If all of the above answers are "NO", then may proceed with Cephalosporin use.       Family History  Problem Relation Age of Onset  . Hypertension Other      Social History Ms. Douglass reports that she has been smoking Cigarettes.  She has a 30.00 pack-year smoking history. She has never used smokeless tobacco. Ms. Betsill reports that she drinks alcohol.   Review of Systems CONSTITUTIONAL: No weight loss, fever, chills, weakness or fatigue.  HEENT: Eyes: No visual loss, blurred vision, double vision or yellow sclerae.No hearing loss, sneezing, congestion, runny nose or sore throat.  SKIN: No rash or itching.  CARDIOVASCULAR:  RESPIRATORY: No shortness of breath, cough or sputum.  GASTROINTESTINAL: No anorexia, nausea, vomiting or diarrhea. No abdominal pain or blood.  GENITOURINARY: No burning on urination, no polyuria NEUROLOGICAL: No headache, dizziness, syncope, paralysis, ataxia, numbness or tingling in the extremities. No change in bowel or bladder control.  MUSCULOSKELETAL: No muscle, back pain, joint pain or stiffness.  LYMPHATICS: No  enlarged nodes. No history of splenectomy.  PSYCHIATRIC: No history of depression or anxiety.  ENDOCRINOLOGIC: No reports of sweating, cold or heat intolerance. No polyuria or polydipsia.  Marland Kitchen   Physical Examination There were no vitals filed for this visit. There were no vitals filed for this visit.  Gen: resting comfortably, no acute distress HEENT: no scleral icterus, pupils equal round and reactive, no  palptable cervical adenopathy,  CV Resp: Clear to auscultation bilaterally GI: abdomen is soft, non-tender, non-distended, normal bowel sounds, no hepatosplenomegaly MSK: extremities are warm, no edema.  Skin: warm, no rash Neuro:  no focal deficits Psych: appropriate affect   Diagnostic Studies     Assessment and Plan        Arnoldo Lenis, M.D., F.A.C.C.

## 2018-02-20 IMAGING — DX DG CHEST 2V
2 series · 2 of 2 positions shown · non-contrast
Comparison: 04/14/2017

CLINICAL DATA: Chest pain and shortness of Breath

EXAM:
CHEST  2 VIEW

[chest pa]
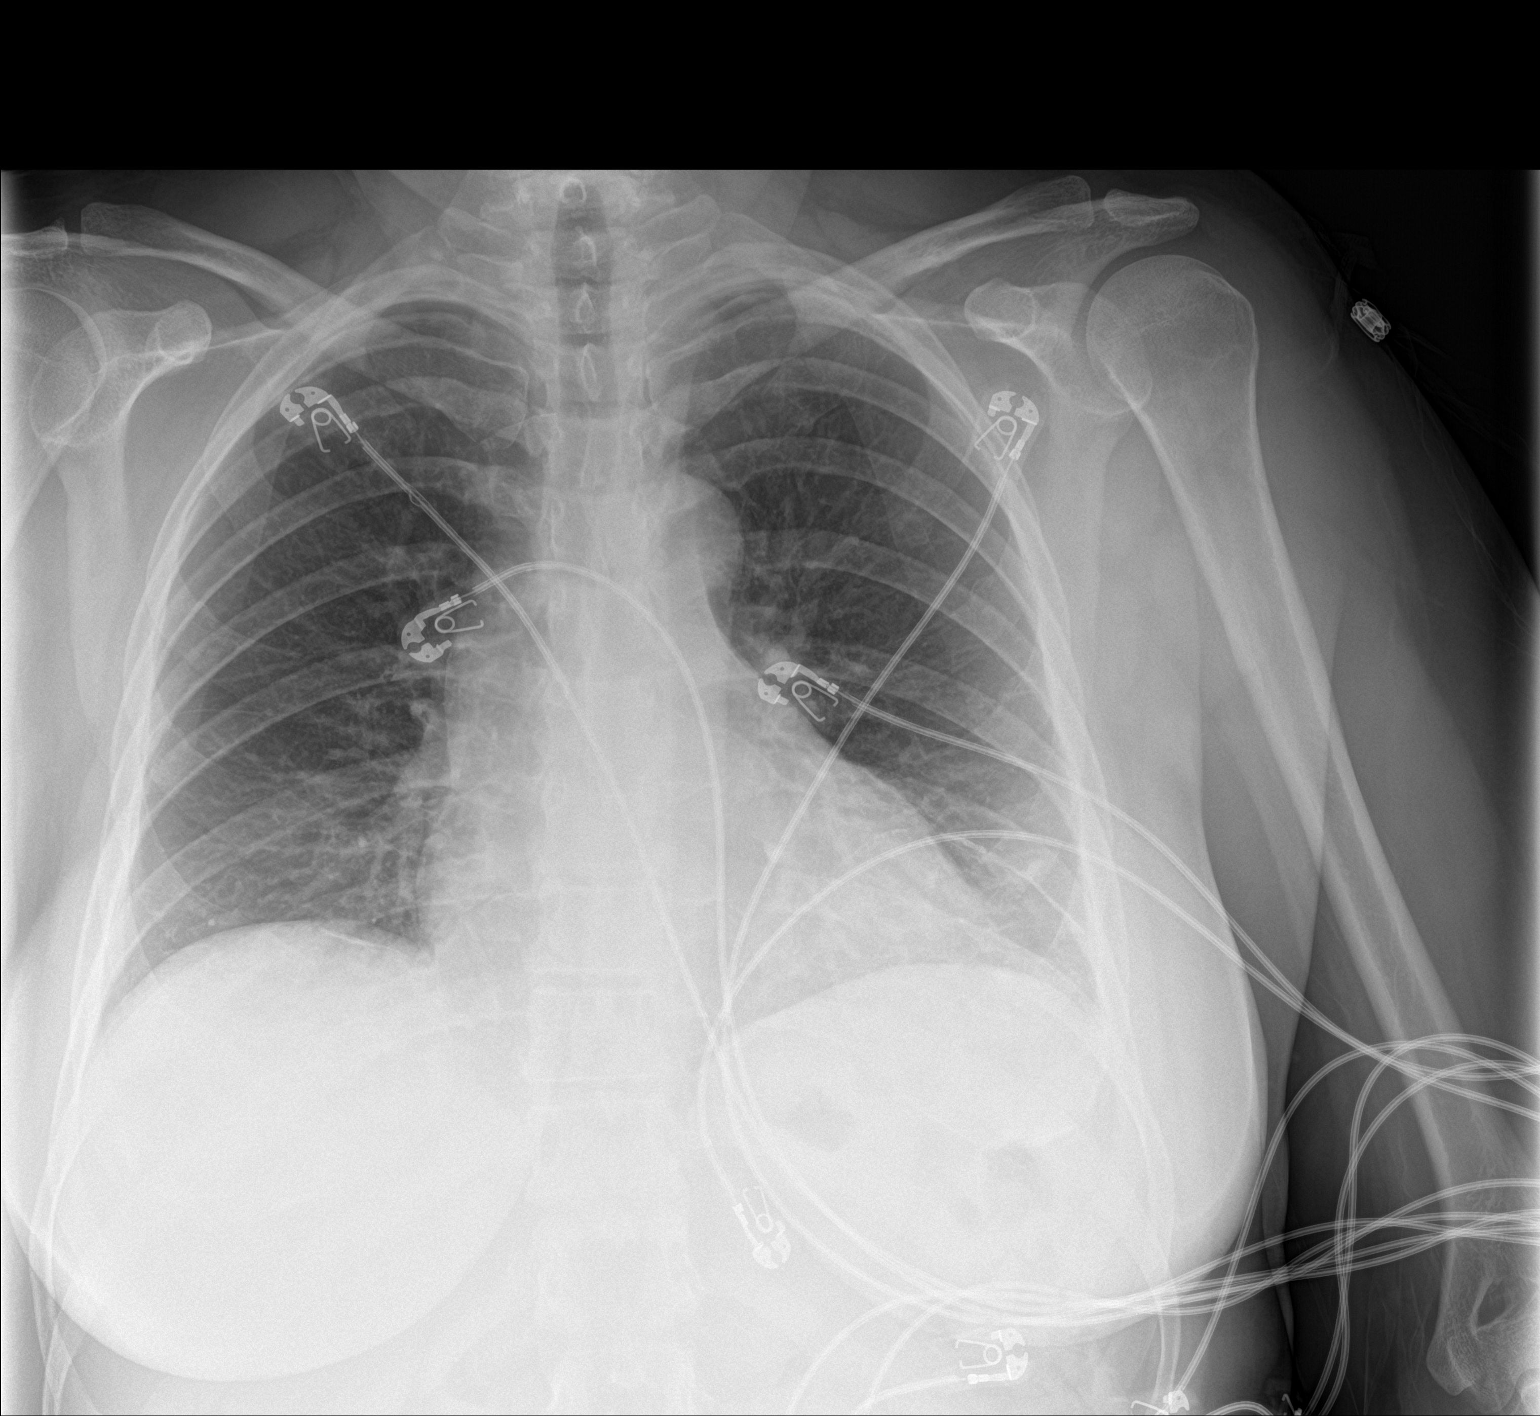

[chest lat]
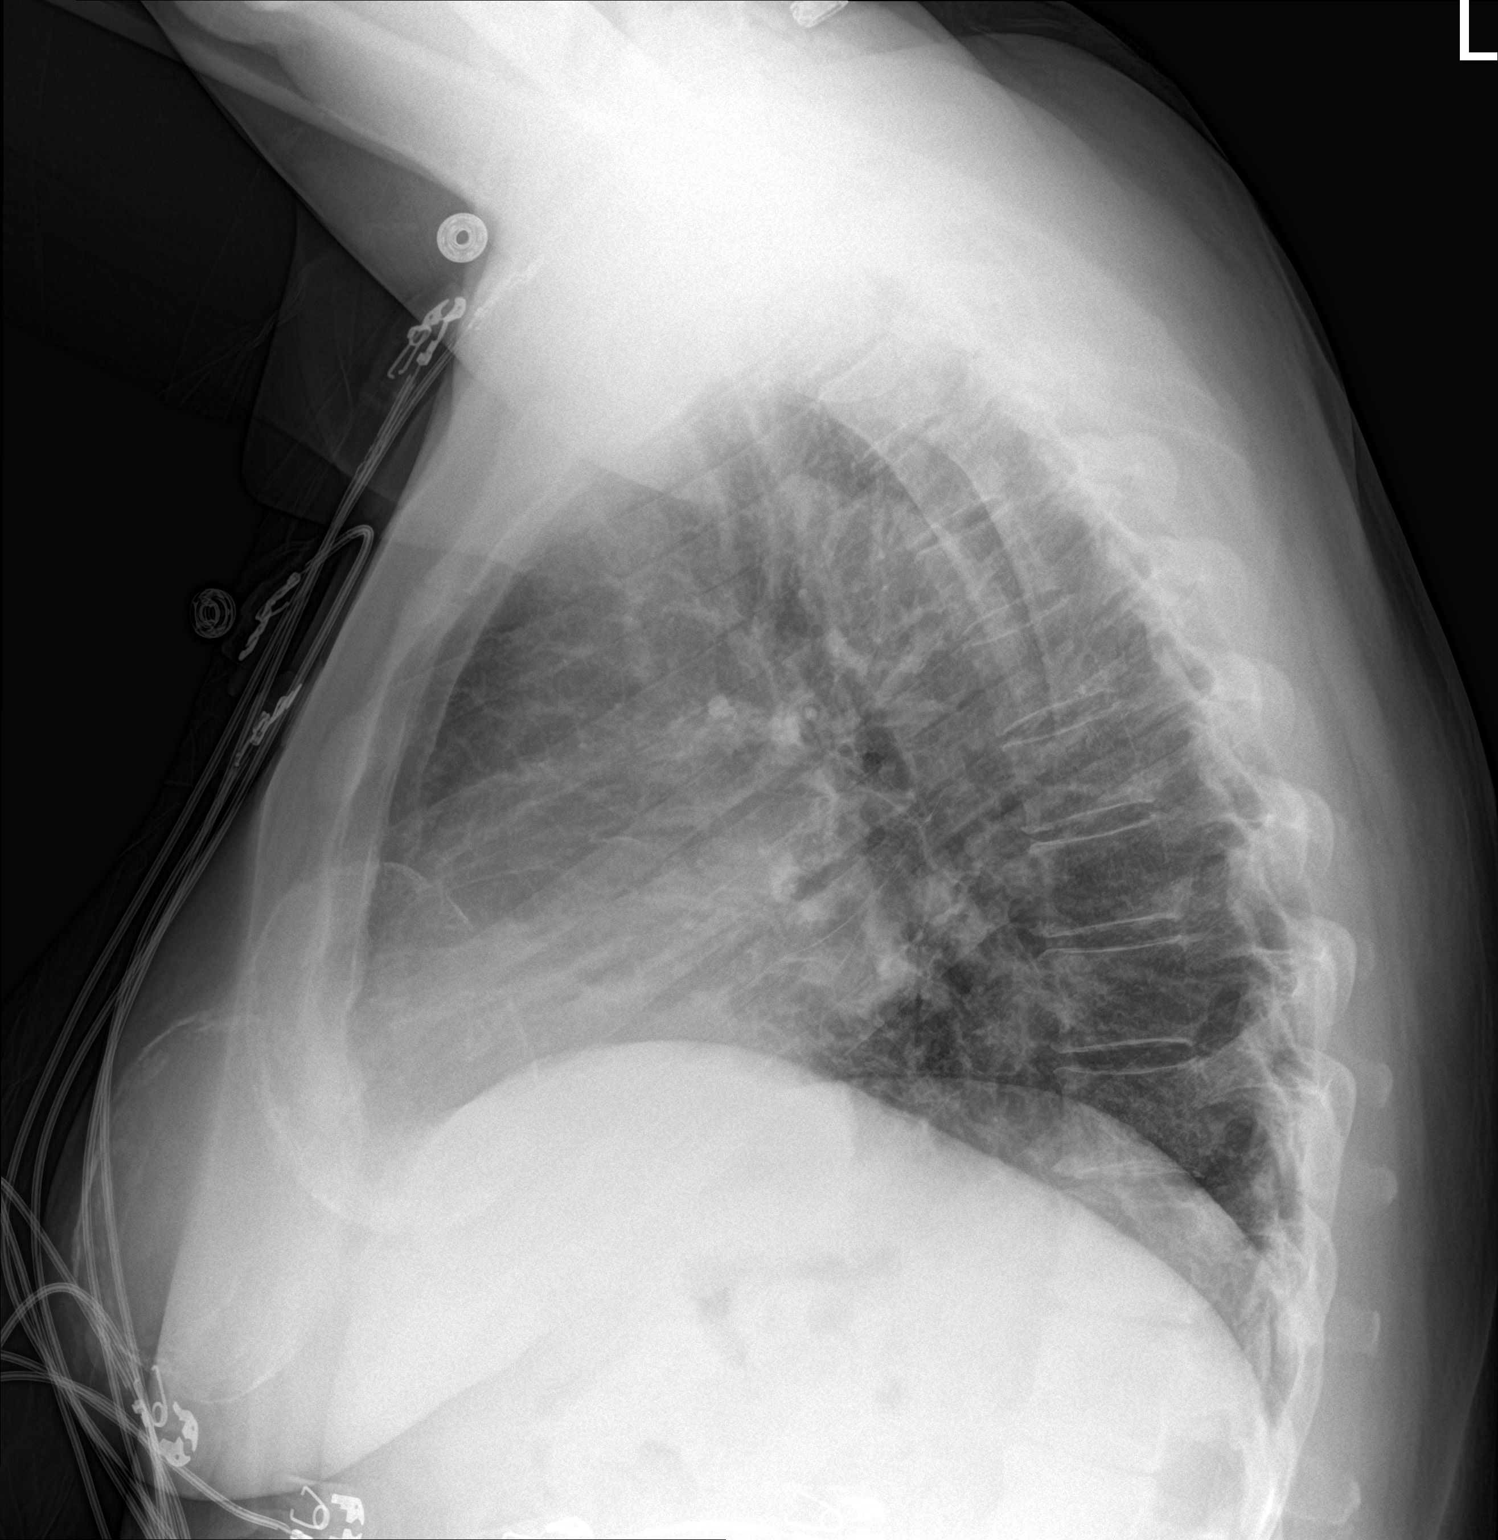

[2 of 2 positions shown; findings below may reference images not displayed]

FINDINGS: The heart size and mediastinal contours are within normal limits.
Both lungs are clear. The visualized skeletal structures show old
left rib fractures.
IMPRESSION: No active cardiopulmonary disease.
